# Patient Record
Sex: Female | Born: 1995 | Hispanic: No | Marital: Single | Smoking: Never smoker
Health system: Southern US, Community
[De-identification: ages and names within clinical notes are randomized; demographics above are authoritative.]

---
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
## 2018-06-13 ENCOUNTER — Ambulatory Visit (HOSPITAL_COMMUNITY)
Admission: EM | Admit: 2018-06-13 | Discharge: 2018-06-13 | Disposition: A | Discharge status: Home / Self Care | Payer: 59 | Attending: Family Medicine | Admitting: Family Medicine

## 2018-06-13 ENCOUNTER — Ambulatory Visit (INDEPENDENT_AMBULATORY_CARE_PROVIDER_SITE_OTHER): Payer: 59

## 2018-06-13 ENCOUNTER — Encounter (HOSPITAL_COMMUNITY): Payer: Self-pay | Admitting: Emergency Medicine

## 2018-06-13 DIAGNOSIS — R1030 Lower abdominal pain, unspecified: Secondary | ICD-10-CM

## 2018-06-13 DIAGNOSIS — R110 Nausea: Secondary | ICD-10-CM

## 2018-06-13 DIAGNOSIS — Z3202 Encounter for pregnancy test, result negative: Secondary | ICD-10-CM | POA: Diagnosis not present

## 2018-06-13 DIAGNOSIS — N200 Calculus of kidney: Secondary | ICD-10-CM

## 2018-06-13 DIAGNOSIS — M545 Low back pain: Secondary | ICD-10-CM

## 2018-06-13 LAB — POCT URINALYSIS DIP (DEVICE)
Bilirubin Urine: NEGATIVE
Glucose, UA: NEGATIVE mg/dL
Ketones, ur: NEGATIVE mg/dL
Leukocytes, UA: NEGATIVE
Nitrite: NEGATIVE
PH: 7 (ref 5.0–8.0)
PROTEIN: NEGATIVE mg/dL
Specific Gravity, Urine: 1.02 (ref 1.005–1.030)
Urobilinogen, UA: 0.2 mg/dL (ref 0.0–1.0)

## 2018-06-13 LAB — POCT PREGNANCY, URINE: PREG TEST UR: NEGATIVE

## 2018-06-13 MED ORDER — TRAMADOL HCL 50 MG PO TABS: 50.0000 mg | ORAL_TABLET | Freq: Four times a day (QID) | ORAL | 0 refills | Status: AC | PRN

## 2018-06-13 MED ORDER — ONDANSETRON 4 MG PO TBDP: 4.0000 mg | ORAL_TABLET | Freq: Three times a day (TID) | ORAL | 0 refills | Status: AC | PRN

## 2018-06-13 NOTE — Discharge Instructions (Signed)
Your urine showed trace blood and the x-ray revealed a 3 mm kidney stone I will give you some pain medication and nausea medication to use as needed if your symptoms worsen I would like for you to strain your urine to see if we can catch the kidney stone If your symptoms continue or worsen you will need to go to the ER for a CT scan

## 2018-06-13 NOTE — ED Notes (Signed)
Gave patient a Technical brewer

## 2018-06-13 NOTE — ED Triage Notes (Signed)
Pt c/o mid lower back pain x1 week, also states she has center abdominal pain. Pt also c/o nausea. Denies diarrhea. Denies issue with urination.

## 2018-06-13 NOTE — ED Provider Notes (Signed)
[facility]    CSN: [id number] Arrival date & time: 06/13/18  1004     History   Chief Complaint Chief Complaint  Patient presents with  . Abdominal Pain  . Back Pain    HPI GLEE HINIKER is a 23 y.o. female.   Patient is a 23 year old female who presents with generalized abdominal discomfort and back pain that has been waxing waning for the past week.  Her symptoms are worse at night.  She has had some associated nausea.  She denies any vomiting or diarrhea.  Her last bowel movement was this morning.  She denies any urinary or vaginal symptoms.  Describes a sensation as a feeling if she is hungry.  She has been eating and drinking normally.  No recent traveling or sick contacts.  No fevers, chills, body aches.  ROS per HPI      History reviewed. No pertinent past medical history.  There are no active problems to display for this patient.   History reviewed. No pertinent surgical history.  OB History   No obstetric history on file.      Home Medications    Prior to Admission medications   Medication Sig Start Date End Date Taking? Authorizing Provider  acetaminophen (TYLENOL) 325 MG tablet Take 650 mg by mouth every 6 (six) hours as needed. For pain    [provider]  ondansetron (ZOFRAN ODT) 4 MG disintegrating tablet Take 1 tablet (4 mg total) by mouth every 8 (eight) hours as needed for nausea or vomiting. 06/13/18   Dahlia Byes A, NP  traMADol (ULTRAM) 50 MG tablet Take 1 tablet (50 mg total) by mouth every 6 (six) hours as needed. 06/13/18   Janace Aris, NP    Family History No family history on file.  Social History Social History   Tobacco Use  . Smoking status: Never Smoker  Substance Use Topics  . Alcohol use: Never    Frequency: Never  . Drug use: Not on file     Allergies   Patient has no known allergies.   Review of Systems Review of Systems   Physical Exam Triage Vital Signs ED Triage Vitals  Enc  Vitals Group     BP 06/13/18 1025 112/80     Pulse Rate 06/13/18 1025 90     Resp 06/13/18 1025 14     Temp 06/13/18 1025 97.8 F (36.6 C)     Temp src --      SpO2 06/13/18 1025 100 %     Weight --      Height --      Head Circumference --      Peak Flow --      Pain Score 06/13/18 1026 4     Pain Loc --      Pain Edu? --      Excl. in GC? --    No data found.  Updated Vital Signs BP 112/80   Pulse 90   Temp 97.8 F (36.6 C)   Resp 14   LMP 05/28/2018 (Exact Date)   SpO2 100%   Visual Acuity Right Eye Distance:   Left Eye Distance:   Bilateral Distance:    Right Eye Near:   Left Eye Near:    Bilateral Near:     Physical Exam Vitals signs and nursing note reviewed.  Constitutional:      General: She is not in acute distress.    Appearance: She is well-developed.  HENT:  Head: Normocephalic and atraumatic.  Eyes:     Conjunctiva/sclera: Conjunctivae normal.  Neck:     Musculoskeletal: Neck supple.  Cardiovascular:     Rate and Rhythm: Normal rate and regular rhythm.     Heart sounds: No murmur.  Pulmonary:     Effort: Pulmonary effort is normal. No respiratory distress.     Breath sounds: Normal breath sounds.  Abdominal:     General: Abdomen is flat. Bowel sounds are decreased. There is no abdominal bruit.     Palpations: Abdomen is soft.     Tenderness: There is abdominal tenderness in the periumbilical area. There is no right CVA tenderness, left CVA tenderness, guarding or rebound. Negative signs include Murphy's sign, Rovsing's sign, McBurney's sign, psoas sign and obturator sign.  Musculoskeletal:     Right shoulder: She exhibits tenderness. She exhibits no deformity.       Arms:  Skin:    General: Skin is warm and dry.  Neurological:     Mental Status: She is alert.  Psychiatric:        Mood and Affect: Mood normal.      UC Treatments / Results  Labs (all labs ordered are listed, but only abnormal results are displayed) Labs Reviewed   POCT URINALYSIS DIP (DEVICE) - Abnormal; Notable for the following components:      Result Value   Hgb urine dipstick TRACE (*)    All other components within normal limits  POC URINE PREG, ED  POCT PREGNANCY, URINE    EKG None  Radiology Dg Abd 1 View  Result Date: 06/13/2018 CLINICAL DATA:  Abdominal pain.  Nausea.  No fever. EXAM: ABDOMEN - 1 VIEW COMPARISON:  None. FINDINGS: Mild fecal loading in the ascending and transverse colon. There is a paucity of small bowel gas. There is a 3 mm density just superior to the right L4 transverse process. Both kidneys are partially obscured by bowel contents, particularly the right. Within this limitation, no renal stones. No other evidence of ureteral stones. No other acute abnormalities. IMPRESSION: 1. There is a 3 mm rounded density just superior to the right L4 transverse process. This could represent a ureteral stone in the appropriate clinical setting. CT imaging could further evaluate if clinically warranted. 2. Mild fecal loading in the ascending and transverse colon. 3. There is a paucity of small bowel gas limiting evaluation but no convincing evidence of obstruction. Electronically Signed   By: Gerome Samavid  Williams III M.D   On: 06/13/2018 11:24    Procedures Procedures (including critical care time)  Medications Ordered in UC Medications - No data to display  Initial Impression / Assessment and Plan / UC Course  I have reviewed the triage vital signs and the nursing notes.  Pertinent labs & imaging results that were available during my care of the patient were reviewed by me and considered in my medical decision making (see chart for details).     Patient is a 23 year old female with waxing and waning generalized abdominal pain and lower back pain that has been going on for approximately week.  Her urine revealed trace blood.  No signs of urinary tract infection. X-ray revealed possible 3 mm stone near the L4 transverse process. This  could be causing her symptoms. We will have her strain her urine She is not in much distress at this time, rating her pain 3 out of 10.  Reports that the pain gets up to around a 10 some nights. We will give  her tramadol to use as needed and Zofran to use as needed If her symptoms continue she will need to go to the ER for CT scan Patient understanding agreeable to plan. Final Clinical Impressions(s) / UC Diagnoses   Final diagnoses:  Kidney stone     Discharge Instructions     Your urine showed trace blood and the x-ray revealed a 3 mm kidney stone I will give you some pain medication and nausea medication to use as needed if your symptoms worsen I would like for you to strain your urine to see if we can catch the kidney stone If your symptoms continue or worsen you will need to go to the ER for a CT scan    ED Prescriptions    Medication Sig Dispense Auth. Provider   traMADol (ULTRAM) 50 MG tablet Take 1 tablet (50 mg total) by mouth every 6 (six) hours as needed. 15 tablet Mazie Fencl A, NP   ondansetron (ZOFRAN ODT) 4 MG disintegrating tablet Take 1 tablet (4 mg total) by mouth every 8 (eight) hours as needed for nausea or vomiting. 20 tablet Dahlia Byes A, NP     Controlled Substance Prescriptions [place] Controlled Substance Registry consulted? Not Applicable   Janace Aris, NP 06/13/18 1142

## 2020-05-08 IMAGING — DX DG ABDOMEN 1V
1 series · 1 of 1 positions shown · non-contrast
Comparison: None.

CLINICAL DATA: Abdominal pain.  Nausea.  No fever.

EXAM:
ABDOMEN - 1 VIEW

[abdomen kub]
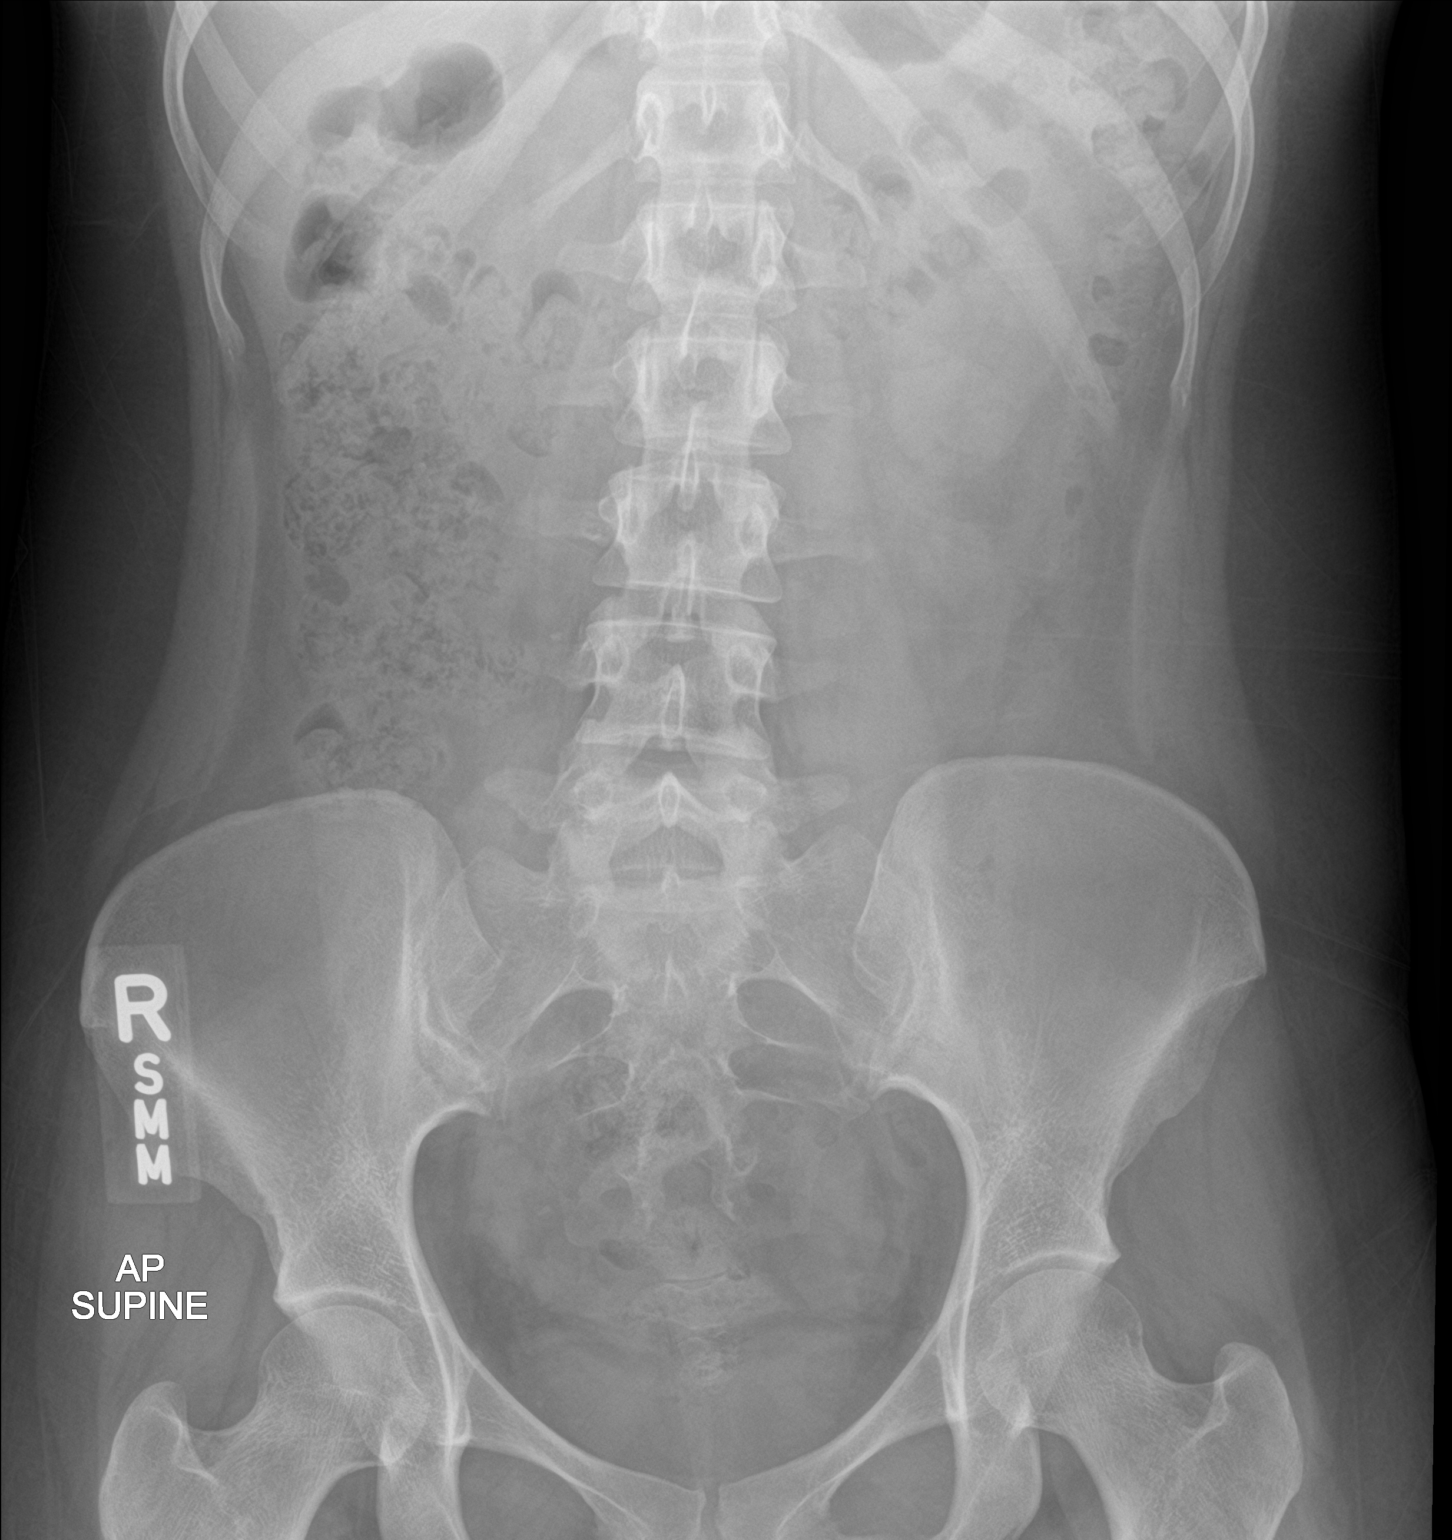

[1 of 1 positions shown; findings below may reference images not displayed]

FINDINGS: Mild fecal loading in the ascending and transverse colon. There is a
paucity of small bowel gas. There is a 3 mm density just superior to
the right L4 transverse process. Both kidneys are partially obscured
by bowel contents, particularly the right. Within this limitation,
no renal stones. No other evidence of ureteral stones. No other
acute abnormalities.
IMPRESSION: 1. There is a 3 mm rounded density just superior to the right L4
transverse process. This could represent a ureteral stone in the
appropriate clinical setting. CT imaging could further evaluate if
clinically warranted.
2. Mild fecal loading in the ascending and transverse colon.
3. There is a paucity of small bowel gas limiting evaluation but no
convincing evidence of obstruction.
